# Patient Record
Sex: Female | Born: 1990 | Race: White | Hispanic: No | Marital: Single
Health system: Southern US, Community
[De-identification: ages and names within clinical notes are randomized; demographics above are authoritative.]

---
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
## 2020-04-12 ENCOUNTER — Other Ambulatory Visit: Payer: Self-pay

## 2020-04-12 ENCOUNTER — Ambulatory Visit (HOSPITAL_COMMUNITY)
Admission: EM | Admit: 2020-04-12 | Discharge: 2020-04-12 | Disposition: A | Discharge status: Home / Self Care | Payer: Medicaid Other | Attending: Family | Admitting: Family

## 2020-04-12 DIAGNOSIS — F1510 Other stimulant abuse, uncomplicated: Secondary | ICD-10-CM

## 2020-04-12 DIAGNOSIS — F419 Anxiety disorder, unspecified: Secondary | ICD-10-CM | POA: Insufficient documentation

## 2020-04-12 DIAGNOSIS — F1590 Other stimulant use, unspecified, uncomplicated: Secondary | ICD-10-CM | POA: Diagnosis not present
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
## 2020-04-12 NOTE — Discharge Instructions (Addendum)
Patient is instructed prior to discharge to:  Take all medications as prescribed by his/her mental healthcare provider. Report any adverse effects and or reactions from the medicines to his/her outpatient provider promptly. Keep all scheduled appointments, to ensure that you are getting refills on time and to avoid any interruption in your medication.  If you are unable to keep an appointment call to reschedule.  Be sure to follow-up with resources and follow-up appointments provided.  Patient has been instructed & cautioned: To not engage in alcohol and or illegal drug use while on prescription medicines. In the event of worsening symptoms, patient is instructed to call the crisis hotline, 911 and or go to the nearest ED for appropriate evaluation and treatment of symptoms. To follow-up with his/her primary care provider for your other medical issues, concerns and or health care needs.    Substance abuse resources and Residential Options:  [facility] day residential substance abuse facility (not an option if you have active assault charges). [address], [place], [place] [address] Phone: [phone]: Ask for Shayla in admissions to complete intake if interested in pursuing this option.  [facility]: Can get intake scheduled; (not an option if you have active assault charges). [address] [place], [place] ([phone]) Call Mon-Fri.  [facility]): (offers outpatient therapy and intensive outpatient substance abuse therapy).  [address], [place], [place] [address] Phone: [phone]  [facility] [employer] [facility]: Offers FREE recovery skills classes, support groups, 1:1 Peer Support, and Compeer Classes. [address], [place], [place] [address] Phone: [phone] (Call to complete intake).   [facility] [address] [place], [place] [address] Phone: [phone] ext [phone]  [facility] provides food,  shelter and other programs and services to the homeless men of [place] through our men's program.  By offering safe shelter, three meals a day, clean clothing, Biblical counseling, financial planning, vocational training, GED/education and employment assistance, we've helped mend the shattered lives of many homeless men since opening in 1974.  We have approximately 267 beds available, with a max of 312 beds including mats for emergency situations and currently house an average of 270 men a night.  Prospective Client Check-In Information Photo ID Required (State/ Out of State/ DOC) - if photo ID is not available, clients are required to have a printout of a police/sheriff's criminal history report. Help out with chores around the Mission. No sex offender of any type (pending, charged, registered and/or any other sex related offenses) will be permitted to check in. Must be willing to abide by all rules, regulations, and policies established by the [facility]. The following will be provided - shelter, food, clothing, and biblical counseling. If you or someone you know is in need of assistance at our men's shelter in [place], [place], please call [phone] ext. [phone].    Homeless Shelter List:     [facility] ([facility])  [address] [place], [place]  Phone: [phone]     [facility]  [address], [place], [place] [address]  Phone: [phone]     [facility] (Women only)  [address], [place], [place] [address]  Phone: [phone]     [facility]  [address] [place], [place] [address]  Phone: [phone]     [facility]:  [address]  [place], [place] [address]    Phone: [phone]     [facility]  [address], [place], [place] [address]  (Check in at 6:00PM for placement at  a local shelter)  Phone: [phone]  

## 2020-04-12 NOTE — ED Notes (Signed)
Patient A&O x 4, ambulatory. Patient discharged in no acute distress. Patient denied SI/HI, A/VH upon discharge. Patient verbalized understanding of all discharge instructions explained by staff, to include follow up appointments and safety plan. Patient reported mood 10/10.  Pt belongings returned to patient from locker intact. Patient escorted to lobby via staff for transport to destination. Safety maintained.  

## 2020-04-23 ENCOUNTER — Telehealth (HOSPITAL_COMMUNITY): Payer: Self-pay | Admitting: General Practice

## 2020-04-23 NOTE — Telephone Encounter (Signed)
Care Management - Follow Up [facility] Discharges   Writer made contact with the patient.  Patient reports that she has not made an appointment with a psychiatrist or therapist.   Patient reports that she is waiting for a referral from her PCP.

## 2020-04-28 ENCOUNTER — Ambulatory Visit (HOSPITAL_COMMUNITY)
Admission: EM | Admit: 2020-04-28 | Discharge: 2020-04-28 | Discharge status: Absent without leave | Payer: Medicaid Other

## 2020-04-28 ENCOUNTER — Other Ambulatory Visit: Payer: Self-pay

## 2020-05-17 ENCOUNTER — Other Ambulatory Visit: Payer: Self-pay

## 2020-05-17 ENCOUNTER — Emergency Department (HOSPITAL_COMMUNITY)
Admission: EM | Admit: 2020-05-17 | Discharge: 2020-05-17 | Disposition: A | Discharge status: Home / Self Care | Payer: Medicaid Other | Attending: Emergency Medicine | Admitting: Emergency Medicine

## 2020-05-17 ENCOUNTER — Emergency Department (HOSPITAL_COMMUNITY): Payer: Medicaid Other

## 2020-05-17 ENCOUNTER — Encounter (HOSPITAL_COMMUNITY): Payer: Self-pay

## 2020-05-17 DIAGNOSIS — F4325 Adjustment disorder with mixed disturbance of emotions and conduct: Secondary | ICD-10-CM | POA: Diagnosis not present

## 2020-05-17 DIAGNOSIS — Z046 Encounter for general psychiatric examination, requested by authority: Secondary | ICD-10-CM

## 2020-05-17 DIAGNOSIS — F4323 Adjustment disorder with mixed anxiety and depressed mood: Secondary | ICD-10-CM

## 2020-05-17 DIAGNOSIS — Z20822 Contact with and (suspected) exposure to covid-19: Secondary | ICD-10-CM | POA: Insufficient documentation

## 2020-05-17 LAB — RESP PANEL BY RT-PCR (FLU A&B, COVID) ARPGX2
Influenza A by PCR: NEGATIVE
Influenza B by PCR: NEGATIVE
SARS Coronavirus 2 by RT PCR: NEGATIVE

## 2020-05-17 LAB — COMPREHENSIVE METABOLIC PANEL
ALT: 16 U/L (ref 0–44)
AST: 20 U/L (ref 15–41)
Albumin: 5.2 g/dL — ABNORMAL HIGH (ref 3.5–5.0)
Alkaline Phosphatase: 81 U/L (ref 38–126)
Anion gap: 11 (ref 5–15)
BUN: 8 mg/dL (ref 6–20)
CO2: 25 mmol/L (ref 22–32)
Calcium: 9.6 mg/dL (ref 8.9–10.3)
Chloride: 101 mmol/L (ref 98–111)
Creatinine, Ser: 0.71 mg/dL (ref 0.44–1.00)
GFR, Estimated: >60 mL/min (ref >60)
Glucose, Bld: 107 mg/dL — ABNORMAL HIGH (ref 70–99)
Potassium: 3.6 mmol/L (ref 3.5–5.1)
Sodium: 137 mmol/L (ref 135–145)
Total Bilirubin: 1.1 mg/dL (ref 0.3–1.2)
Total Protein: 8.8 g/dL — ABNORMAL HIGH (ref 6.5–8.1)

## 2020-05-17 LAB — RAPID URINE DRUG SCREEN, HOSP PERFORMED
Amphetamines: NOT DETECTED
Barbiturates: NOT DETECTED
Benzodiazepines: NOT DETECTED
Cocaine: NOT DETECTED
Opiates: NOT DETECTED
Tetrahydrocannabinol: NOT DETECTED

## 2020-05-17 LAB — URINALYSIS, ROUTINE W REFLEX MICROSCOPIC
Bacteria, UA: NONE SEEN
Bilirubin Urine: NEGATIVE
Glucose, UA: NEGATIVE mg/dL
Hgb urine dipstick: NEGATIVE
Ketones, ur: NEGATIVE mg/dL
Leukocytes,Ua: NEGATIVE
Nitrite: NEGATIVE
Protein, ur: NEGATIVE mg/dL
Specific Gravity, Urine: 1.006 (ref 1.005–1.030)
pH: 5 (ref 5.0–8.0)

## 2020-05-17 LAB — CBC
HCT: 44.6 % (ref 36.0–46.0)
Hemoglobin: 14.6 g/dL (ref 12.0–15.0)
MCH: 29 pg (ref 26.0–34.0)
MCHC: 32.7 g/dL (ref 30.0–36.0)
MCV: 88.5 fL (ref 80.0–100.0)
Platelets: 315 10*3/uL (ref 150–400)
RBC: 5.04 MIL/uL (ref 3.87–5.11)
RDW: 14.6 % (ref 11.5–15.5)
WBC: 19.3 10*3/uL — ABNORMAL HIGH (ref 4.0–10.5)
nRBC: 0 % (ref 0.0–0.2)

## 2020-05-17 LAB — ACETAMINOPHEN LEVEL: Acetaminophen (Tylenol), Serum: <10 ug/mL — ABNORMAL LOW (ref 10–30)

## 2020-05-17 LAB — SALICYLATE LEVEL: Salicylate Lvl: <7 mg/dL — ABNORMAL LOW (ref 7.0–30.0)

## 2020-05-17 LAB — ETHANOL: Alcohol, Ethyl (B): <10 mg/dL (ref <10)

## 2020-05-17 LAB — I-STAT BETA HCG BLOOD, ED (MC, WL, AP ONLY): I-stat hCG, quantitative: <5 m[IU]/mL (ref <5)

## 2020-05-17 MED ORDER — QUETIAPINE FUMARATE 50 MG PO TABS: 50.0000 mg | ORAL_TABLET | Freq: Every day | ORAL | Status: DC

## 2020-05-17 MED ORDER — NICOTINE 14 MG/24HR TD PT24
14.0000 mg | MEDICATED_PATCH | Freq: Every day | TRANSDERMAL | Status: DC
  Administered 2020-05-17: 14 mg via TRANSDERMAL
  Filled 2020-05-17: qty 1

## 2020-05-17 NOTE — BHH Counselor (Signed)
TTS assessment complete. Pending provider disposition. 

## 2020-05-17 NOTE — Consult Note (Signed)
Telepsych Consultation   Reason for Consult: Involuntary commitment petition Referring Physician: [facility] physician Location of Patient: [facility] Location of Provider: [facility] TTS Department  Patient Identification: Kristi Caldwell MRN:  [id number] Principal Diagnosis: Adjustment disorder with mixed anxiety and depressed mood Diagnosis:  Principal Problem:   Adjustment disorder with mixed anxiety and depressed mood   Total Time spent with patient: 30 minutes  Subjective:   Kristi Caldwell is a 30 y.o. female patient.  Patient states "I did not threaten to kill my mom or anyone else, I do not intend to harm anyone, ever." Patient reports while she did not threaten to kill her mother or her boyfriend's girlfriend she did argue with her mother on yesterday.  Patient states she feels that her mother "shuts me down every time I try to talk to her."  Patient states she does argue with her mother periodically.  Patient denies any physical aggression during these arguments.  HPI:  Patient reports recent stressors include "being stuck at mom's home."  Patient also reports being frustrated that she is currently not employed, not attending school, and does not have a driver's license or car. Patient feels that she is being judged by her past by her mother.  Patient reports while she has a history of substance use disorder she has been sober for some time and feels that mother continues to judge her based on previous choices. Patient reports she has spoken with mother about leaving her home but currently does not have "anywhere else to go."  Patient reports she is interested in a halfway house if 1 is available in [place] area.  Patient assessed by nurse practitioner.  Patient alert and oriented, answers appropriately.  Patient becomes tearful when discussing strained relationship with her mother. Patient denies suicidal and homicidal  ideations.  Patient denies self-harm behaviors.  Patient denies both auditory and visual hallucinations.  There is no evidence of delusional thought content and no indication that patient is responding to internal stimuli.  Patient denies symptoms of paranoia.  Patient resides in [place] with her mother and stepfather.  Patient denies access to weapons.  Patient is currently unemployed.  Patient denies both alcohol and substance use.  Patient reports she has a history of substance use disorder.  Patient endorses average sleep and appetite.  Patient reports she was diagnosed with bipolar disorder as well as schizophrenia while in crisis during birth of second child.  Patient reports she feels that these diagnoses are not accurate.  Patient reports she is compliant with Seroquel and feels this medication is effective.  Patient gives verbal consent to speak with her mother, Sydell Axon.  TTS counselor spoke with patient's mother who reports she would prefer patient not return to her home.    Past Psychiatric History: Methamphetamine use disorder  Risk to Self:  Denies Risk to Others:  Denies Prior Inpatient Therapy:  Yes, last admission in [place] Prior Outpatient Therapy:  Prior outpatient psychiatric provider in [place]  Past Medical History: History reviewed. No pertinent past medical history. History reviewed. No pertinent surgical history. Family History: No family history on file. Family Psychiatric  History: None reported Social History:  Social History   Substance and Sexual Activity  Alcohol Use None     Social History   Substance and Sexual Activity  Drug Use Not on file    Social History   Socioeconomic History  . Marital status: Single    Spouse name: Not on  file  . Number of children: Not on file  . Years of education: Not on file  . Highest education level: Not on file  Occupational History  . Not on file  Tobacco Use  . Smoking status: Not on file  .  Smokeless tobacco: Not on file  Substance and Sexual Activity  . Alcohol use: Not on file  . Drug use: Not on file  . Sexual activity: Not on file  Other Topics Concern  . Not on file  Social History Narrative  . Not on file   Social Determinants of Health   Financial Resource Strain: Not on file  Food Insecurity: Not on file  Transportation Needs: Not on file  Physical Activity: Not on file  Stress: Not on file  Social Connections: Not on file   Additional Social History:    Allergies:  No Known Allergies  Labs:  Results for orders placed or performed during the hospital encounter of 05/17/20 (from the past 48 hour(s))  Rapid urine drug screen (hospital performed)     Status: None   Collection Time: 05/17/20  3:14 AM  Result Value Ref Range   Opiates NONE DETECTED NONE DETECTED   Cocaine NONE DETECTED NONE DETECTED   Benzodiazepines NONE DETECTED NONE DETECTED   Amphetamines NONE DETECTED NONE DETECTED   Tetrahydrocannabinol NONE DETECTED NONE DETECTED   Barbiturates NONE DETECTED NONE DETECTED    Comment: (NOTE) DRUG SCREEN FOR MEDICAL PURPOSES ONLY.  IF CONFIRMATION IS NEEDED FOR ANY PURPOSE, NOTIFY LAB WITHIN 5 DAYS.  LOWEST DETECTABLE LIMITS FOR URINE DRUG SCREEN Drug Class                     Cutoff (ng/mL) Amphetamine and metabolites    1000 Barbiturate and metabolites    200 Benzodiazepine                 200 Tricyclics and metabolites     300 Opiates and metabolites        300 Cocaine and metabolites        300 THC                            50 Performed at [facility], [address], [place], [place] [address]   Urinalysis, Routine w reflex microscopic     Status: Abnormal   Collection Time: 05/17/20  3:14 AM  Result Value Ref Range   Color, Urine YELLOW YELLOW   APPearance HAZY (A) CLEAR   Specific Gravity, Urine 1.006 1.005 - 1.030   pH 5.0 5.0 - 8.0   Glucose, UA NEGATIVE NEGATIVE mg/dL   Hgb urine dipstick NEGATIVE  NEGATIVE   Bilirubin Urine NEGATIVE NEGATIVE   Ketones, ur NEGATIVE NEGATIVE mg/dL   Protein, ur NEGATIVE NEGATIVE mg/dL   Nitrite NEGATIVE NEGATIVE   Leukocytes,Ua NEGATIVE NEGATIVE   RBC / HPF 0-5 0 - 5 RBC/hpf   Bacteria, UA NONE SEEN NONE SEEN   Squamous Epithelial / LPF 6-10 0 - 5    Comment: Performed at [facility], [address], [place], [place] [address]  Comprehensive metabolic panel     Status: Abnormal   Collection Time: 05/17/20  3:24 AM  Result Value Ref Range   Sodium 137 135 - 145 mmol/L   Potassium 3.6 3.5 - 5.1 mmol/L   Chloride 101 98 - 111 mmol/L   CO2 25 22 - 32 mmol/L  Glucose, Bld 107 (H) 70 - 99 mg/dL    Comment: Glucose reference range applies only to samples taken after fasting for at least 8 hours.   BUN 8 6 - 20 mg/dL   Creatinine, Ser 0.34 0.44 - 1.00 mg/dL   Calcium 9.6 8.9 - 74.2 mg/dL   Total Protein 8.8 (H) 6.5 - 8.1 g/dL   Albumin 5.2 (H) 3.5 - 5.0 g/dL   AST 20 15 - 41 U/L   ALT 16 0 - 44 U/L   Alkaline Phosphatase 81 38 - 126 U/L   Total Bilirubin 1.1 0.3 - 1.2 mg/dL   GFR, Estimated >59 >56 mL/min    Comment: (NOTE) Calculated using the CKD-EPI Creatinine Equation (2021)    Anion gap 11 5 - 15    Comment: Performed at [facility], [address] [place], [place] [address]  Ethanol     Status: None   Collection Time: 05/17/20  3:24 AM  Result Value Ref Range   Alcohol, Ethyl (B) <10 <10 mg/dL    Comment: (NOTE) Lowest detectable limit for serum alcohol is 10 mg/dL.  For medical purposes only. Performed at [facility], [address], [place], [place] [address]   Salicylate level     Status: Abnormal   Collection Time: 05/17/20  3:24 AM  Result Value Ref Range   Salicylate Lvl <7.0 (L) 7.0 - 30.0 mg/dL    Comment: Performed at [facility], [address] [place], [place] [address]  Acetaminophen level     Status: Abnormal   Collection Time:  05/17/20  3:24 AM  Result Value Ref Range   Acetaminophen (Tylenol), Serum <10 (L) 10 - 30 ug/mL    Comment: (NOTE) Therapeutic concentrations vary significantly. A range of 10-30 ug/mL  may be an effective concentration for many patients. However, some  are best treated at concentrations outside of this range. Acetaminophen concentrations >150 ug/mL at 4 hours after ingestion  and >50 ug/mL at 12 hours after ingestion are often associated with  toxic reactions.  Performed at [facility], [address] [place], [place] [address]   cbc     Status: Abnormal   Collection Time: 05/17/20  3:24 AM  Result Value Ref Range   WBC 19.3 (H) 4.0 - 10.5 K/uL   RBC 5.04 3.87 - 5.11 MIL/uL   Hemoglobin 14.6 12.0 - 15.0 g/dL   HCT 30.1 60.1 - 09.3 %   MCV 88.5 80.0 - 100.0 fL   MCH 29.0 26.0 - 34.0 pg   MCHC 32.7 30.0 - 36.0 g/dL   RDW 23.5 57.3 - 22.0 %   Platelets 315 150 - 400 K/uL   nRBC 0.0 0.0 - 0.2 %    Comment: Performed at [facility], [address], [place], [place] [address]  I-Stat beta hCG blood, ED     Status: None   Collection Time: 05/17/20  3:40 AM  Result Value Ref Range   I-stat hCG, quantitative <5.0 <5 mIU/mL   Comment 3            Comment:   GEST. AGE      CONC.  (mIU/mL)   <=1 WEEK        5 - 50     2 WEEKS       50 - 500     3 WEEKS       100 - 10,000     4 WEEKS  1,000 - 30,000        FEMALE AND NON-PREGNANT FEMALE:     LESS THAN 5 mIU/mL   Resp Panel by RT-PCR (Flu A&B, Covid) Nasopharyngeal Swab     Status: None   Collection Time: 05/17/20  4:33 AM   Specimen: Nasopharyngeal Swab; Nasopharyngeal(NP) swabs in vial transport medium  Result Value Ref Range   SARS Coronavirus 2 by RT PCR NEGATIVE NEGATIVE    Comment: (NOTE) SARS-CoV-2 target nucleic acids are NOT DETECTED.  The SARS-CoV-2 RNA is generally detectable in upper respiratory specimens during the acute phase of infection. The lowest concentration of  SARS-CoV-2 viral copies this assay can detect is 138 copies/mL. A negative result does not preclude SARS-Cov-2 infection and should not be used as the sole basis for treatment or other patient management decisions. A negative result may occur with  improper specimen collection/handling, submission of specimen other than nasopharyngeal swab, presence of viral mutation(s) within the areas targeted by this assay, and inadequate number of viral copies(<138 copies/mL). A negative result must be combined with clinical observations, patient history, and epidemiological information. The expected result is Negative.  Fact Sheet for Patients:  EntrepreneurPulse.com.au  Fact Sheet for Healthcare Providers:  IncredibleEmployment.be  This test is no t yet approved or cleared by the [place] FDA and  has been authorized for detection and/or diagnosis of SARS-CoV-2 by FDA under an Emergency Use Authorization (EUA). This EUA will remain  in effect (meaning this test can be used) for the duration of the COVID-19 declaration under Section 564(b)(1) of the Act, 21 U.S.C.section 360bbb-3(b)(1), unless the authorization is terminated  or revoked sooner.       Influenza A by PCR NEGATIVE NEGATIVE   Influenza B by PCR NEGATIVE NEGATIVE    Comment: (NOTE) The Xpert Xpress SARS-CoV-2/FLU/RSV plus assay is intended as an aid in the diagnosis of influenza from Nasopharyngeal swab specimens and should not be used as a sole basis for treatment. Nasal washings and aspirates are unacceptable for Xpert Xpress SARS-CoV-2/FLU/RSV testing.  Fact Sheet for Patients: EntrepreneurPulse.com.au  Fact Sheet for Healthcare Providers: IncredibleEmployment.be  This test is not yet approved or cleared by the [place] FDA and has been authorized for detection and/or diagnosis of SARS-CoV-2 by FDA under an Emergency Use Authorization (EUA).  This EUA will remain in effect (meaning this test can be used) for the duration of the COVID-19 declaration under Section 564(b)(1) of the Act, 21 U.S.C. section 360bbb-3(b)(1), unless the authorization is terminated or revoked.  Performed at [facility], [place] [address] [place], [place] [address]     Medications:  Current Facility-Administered Medications  Medication Dose Route Frequency Provider Last Rate Last Admin  . nicotine (NICODERM CQ - dosed in mg/24 hours) patch 14 mg  14 mg Transdermal Daily Emmaline Kluver, FNP   14 mg at 05/17/20 1037   Current Outpatient Medications  Medication Sig Dispense Refill  . sertraline (ZOLOFT) 25 MG tablet Take 25 mg by mouth daily.      Musculoskeletal: Strength & Muscle Tone: within normal limits Gait & Station: normal Patient leans: N/A  Psychiatric Specialty Exam: Physical Exam Vitals and nursing note reviewed.  Constitutional:      Appearance: She is well-developed.  HENT:     Head: Normocephalic.  Cardiovascular:     Rate and Rhythm: Normal rate.  Pulmonary:     Effort: Pulmonary effort is normal.  Neurological:     Mental Status: She is alert and  oriented to person, place, and time.  Psychiatric:        Attention and Perception: Attention and perception normal.        Mood and Affect: Affect normal. Mood is anxious and depressed.        Speech: Speech normal.        Behavior: Behavior normal. Behavior is cooperative.        Thought Content: Thought content normal.        Cognition and Memory: Cognition and memory normal.        Judgment: Judgment normal.     Review of Systems  Constitutional: Negative.   HENT: Negative.   Eyes: Negative.   Respiratory: Negative.   Cardiovascular: Negative.   Gastrointestinal: Negative.   Genitourinary: Negative.   Musculoskeletal: Negative.   Skin: Negative.   Neurological: Negative.   Psychiatric/Behavioral: The patient is nervous/anxious.     Blood  pressure (!) 100/58, pulse 68, temperature 98.2 F (36.8 C), temperature source Oral, resp. rate 15, SpO2 93 %.There is no height or weight on file to calculate BMI.  General Appearance: Casual and Fairly Groomed  Eye Contact:  Good  Speech:  Clear and Coherent and Normal Rate  Volume:  Normal  Mood:  Anxious and Depressed  Affect:  Congruent  Thought Process:  Coherent, Goal Directed and Descriptions of Associations: Intact  Orientation:  Full (Time, Place, and Person)  Thought Content:  WDL and Logical  Suicidal Thoughts:  No  Homicidal Thoughts:  No  Memory:  Immediate;   Good Recent;   Good Remote;   Good  Judgement:  Good  Insight:  Fair  Psychomotor Activity:  Normal  Concentration:  Concentration: Good and Attention Span: Good  Recall:  Good  Fund of Knowledge:  Good  Language:  Good  Akathisia:  No  Handed:  Right  AIMS (if indicated):     Assets:  Communication Skills Desire for Improvement Financial Resources/Insurance Housing Intimacy Leisure Time Physical Health Resilience Social Support Talents/Skills  ADL's:  Intact  Cognition:  WNL  Sleep:        Treatment Plan Summary: Patient reviewed with Dr. Nelly Rout. Social work consult ordered to offer housing resources. Peer support consult initiated per patient request. Medication management  Restart home medication: -Seroquel 50 mg nightly  Follow-up with outpatient psychiatry, intensive outpatient program at [facility].   Disposition: No evidence of imminent risk to self or others at present.   Patient does not meet criteria for psychiatric inpatient admission. Supportive therapy provided about ongoing stressors. Discussed crisis plan, support from social network, calling 911, coming to the Emergency Department, and calling Suicide Hotline.  This service was provided via telemedicine using a 2-way, interactive audio and video technology.  Names of all persons  participating in this telemedicine service and their role in this encounter. Name: Francee Piccolo Role: Patient  Name: Berneice Heinrich Role: FNP  Name:  Nelly Rout Role: Psychiatrist    Patrcia Dolly, FNP 05/17/2020 10:45 AM
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
## 2020-05-17 NOTE — Patient Outreach (Signed)
CPSS met with Pt an was able to gain information to better assist Pt. CPSS was made aware that Pt has been dealing with Depression, an that it has affected her relationship with her family. CPSS was able to present a few options for services an. CPSS contacted Mrs Phoebe Sharps for setting up services at [facility]. CPSS also issued contact information for ACTT services in [place].

## 2020-05-17 NOTE — Patient Outreach (Incomplete)
ED Peer Support Specialist Patient Intake (Complete at intake & 30-60 Day Follow-up)  Name: Kristi Caldwell  MRN: [id number]  Age: 30 y.o.   Date of Admission: 05/17/2020  Intake: Initial Comments:      Primary Reason Admitted: Depression   Lab values: Alcohol/ETOH: Not completed Positive UDS? 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 No  Number of ED visits:    Reason(s) for visit:    In past year, has patient been hospitalized for any reason? No  Number of hospitalizations:    Reason(s) for hospitalization:    In past year, has patient been arrested? No  Number of arrests:    Reason(s) for arrest:    In past year, has patient been incarcerated? No  Number of incarcerations:    Reason(s) for incarceration:    In past year, has patient received medication-assisted treatment? No  In past year, patient received the following treatments: Other (comment)  In past year, has patient received any harm reduction services? No  Did this include any of the following?    In past year, has patient received care from a mental health provider for diagnosis other than SUD? No  In past year, is this first time patient has overdosed? No  Number of past overdoses:    In past year, is this first time patient has been hospitalized for an overdose? No  Number of hospitalizations for overdose(s):    Is patient currently  receiving treatment for a mental health diagnosis? Yes  Patient reports experiencing difficulty participating in SUD treatment: No    Most important reason(s) for this difficulty?    Has patient received prior services for treatment? No  In past, patient has received services from following agencies:    Plan of Care:  Suggested follow up at these agencies/treatment centers: ACTT [facility] Treatment Team),Individual therapy  Other information: ***   Merlinda Frederick, CPSS  05/17/2020 12:58 PM

## 2020-05-17 NOTE — Progress Notes (Signed)
TOC CM follow up on patient, she has list of local shelters. Her mother is providing transportation. Isidoro Donning RN CCM, [facility] ED TOC CM [phone]

## 2020-05-17 NOTE — ED Notes (Signed)
Pt given d/c instructions and advised to call women's shelters prior to pt mother arrival. Pt is currently making calls at this time

## 2020-05-17 NOTE — BHH Counselor (Addendum)
BHH contacted patient's mother and notified her of disposition. Mother upset and stating she does not feel safe with her returning to her home. This counselor validated her concerns and explained we would be giving her resources for housing. Mother stated well then she would come and pick her up. This counselor informed her that would need to be within 2 hours. She demonstrated understanding.  Mother states her husband will call [facility] to pick patient up when she is ready for d/c. Mother verified she has phone number to [facility] ED to coordinate pick up time.

## 2020-05-17 NOTE — ED Provider Notes (Signed)
[facility]-EMERGENCY DEPT Provider Note   CSN: [id number] Arrival date & time: 05/17/20  4627     History No chief complaint on file.   Kristi Caldwell is a 30 y.o. female.  Patient with history of bipolar and schizophrenia to ED under Involuntary Commitment by mother reporting noncompliance with medications, increasing volatile behavior with physical and verbal aggression toward mom tonight, auditory and visual (bugs crawling on her) hallucinations. Mom reports the patient has made threats to harm her ex-boyfriend and new girlfriend and used social media to post same threats as well as threats against her mother. The patient denies symptoms.   The history is provided by the patient and the police. No language interpreter was used.       History reviewed. No pertinent past medical history.  There are no problems to display for this patient.   History reviewed. No pertinent surgical history.   OB History   No obstetric history on file.     No family history on file.     Home Medications Prior to Admission medications   Medication Sig Start Date End Date Taking? Authorizing Provider  sertraline (ZOLOFT) 25 MG tablet Take 25 mg by mouth daily.    [provider]    Allergies    Patient has no known allergies.  Review of Systems   Review of Systems  Constitutional: Negative for chills and fever.  HENT: Negative.   Respiratory: Negative.   Cardiovascular: Negative.   Gastrointestinal: Negative.   Musculoskeletal: Negative.   Skin: Negative.   Neurological: Negative.   Psychiatric/Behavioral:       See HPI.    Physical Exam Updated Vital Signs BP 107/72 (BP Location: Left Arm)   Pulse 96   Temp 98.1 F (36.7 C) (Oral)   Resp 18   SpO2 97%   Physical Exam Vitals and nursing note reviewed.  Constitutional:      General: She is not in acute distress.    Appearance: She is well-developed and well-nourished.  Cardiovascular:      Rate and Rhythm: Normal rate.  Pulmonary:     Effort: Pulmonary effort is normal.  Abdominal:     Palpations: Abdomen is soft.  Musculoskeletal:        General: Normal range of motion.     Cervical back: Normal range of motion.  Skin:    General: Skin is warm and dry.  Neurological:     Mental Status: She is alert and oriented to person, place, and time.     ED Results / Procedures / Treatments   Labs (all labs ordered are listed, but only abnormal results are displayed) Labs Reviewed  COMPREHENSIVE METABOLIC PANEL - Abnormal; Notable for the following components:      Result Value   Glucose, Bld 107 (*)    Total Protein 8.8 (*)    Albumin 5.2 (*)    All other components within normal limits  SALICYLATE LEVEL - Abnormal; Notable for the following components:   Salicylate Lvl <7.0 (*)    All other components within normal limits  ACETAMINOPHEN LEVEL - Abnormal; Notable for the following components:   Acetaminophen (Tylenol), Serum <10 (*)    All other components within normal limits  CBC - Abnormal; Notable for the following components:   WBC 19.3 (*)    All other components within normal limits  RESP PANEL BY RT-PCR (FLU A&B, COVID) [id number]  ETHANOL  RAPID URINE DRUG SCREEN, HOSP PERFORMED  URINALYSIS, ROUTINE  W REFLEX MICROSCOPIC  I-STAT BETA HCG BLOOD, ED ([facility], [facility], [facility] ONLY)   Results for orders placed or performed during the hospital encounter of 05/17/20  Comprehensive metabolic panel  Result Value Ref Range   Sodium 137 135 - 145 mmol/L   Potassium 3.6 3.5 - 5.1 mmol/L   Chloride 101 98 - 111 mmol/L   CO2 25 22 - 32 mmol/L   Glucose, Bld 107 (H) 70 - 99 mg/dL   BUN 8 6 - 20 mg/dL   Creatinine, Ser 1.24 0.44 - 1.00 mg/dL   Calcium 9.6 8.9 - 58.0 mg/dL   Total Protein 8.8 (H) 6.5 - 8.1 g/dL   Albumin 5.2 (H) 3.5 - 5.0 g/dL   AST 20 15 - 41 U/L   ALT 16 0 - 44 U/L   Alkaline Phosphatase 81 38 - 126 U/L   Total Bilirubin 1.1 0.3 - 1.2 mg/dL   GFR, Estimated  >99 >83 mL/min   Anion gap 11 5 - 15  Ethanol  Result Value Ref Range   Alcohol, Ethyl (B) <10 <10 mg/dL  Salicylate level  Result Value Ref Range   Salicylate Lvl <7.0 (L) 7.0 - 30.0 mg/dL  Acetaminophen level  Result Value Ref Range   Acetaminophen (Tylenol), Serum <10 (L) 10 - 30 ug/mL  cbc  Result Value Ref Range   WBC 19.3 (H) 4.0 - 10.5 K/uL   RBC 5.04 3.87 - 5.11 MIL/uL   Hemoglobin 14.6 12.0 - 15.0 g/dL   HCT 38.2 50.5 - 39.7 %   MCV 88.5 80.0 - 100.0 fL   MCH 29.0 26.0 - 34.0 pg   MCHC 32.7 30.0 - 36.0 g/dL   RDW 67.3 41.9 - 37.9 %   Platelets 315 150 - 400 K/uL   nRBC 0.0 0.0 - 0.2 %  Rapid urine drug screen (hospital performed)  Result Value Ref Range   Opiates NONE DETECTED NONE DETECTED   Cocaine NONE DETECTED NONE DETECTED   Benzodiazepines NONE DETECTED NONE DETECTED   Amphetamines NONE DETECTED NONE DETECTED   Tetrahydrocannabinol NONE DETECTED NONE DETECTED   Barbiturates NONE DETECTED NONE DETECTED  Urinalysis, Routine w reflex microscopic  Result Value Ref Range   Color, Urine YELLOW YELLOW   APPearance HAZY (A) CLEAR   Specific Gravity, Urine 1.006 1.005 - 1.030   pH 5.0 5.0 - 8.0   Glucose, UA NEGATIVE NEGATIVE mg/dL   Hgb urine dipstick NEGATIVE NEGATIVE   Bilirubin Urine NEGATIVE NEGATIVE   Ketones, ur NEGATIVE NEGATIVE mg/dL   Protein, ur NEGATIVE NEGATIVE mg/dL   Nitrite NEGATIVE NEGATIVE   Leukocytes,Ua NEGATIVE NEGATIVE   RBC / HPF 0-5 0 - 5 RBC/hpf   Bacteria, UA NONE SEEN NONE SEEN   Squamous Epithelial / LPF 6-10 0 - 5  I-Stat beta hCG blood, ED  Result Value Ref Range   I-stat hCG, quantitative <5.0 <5 mIU/mL   Comment 3            EKG None  Radiology No results found. No results found.  Procedures Procedures (including critical care time)  Medications Ordered in ED Medications - No data to display  ED Course  I have reviewed the triage vital signs and the nursing notes.  Pertinent labs & imaging results that were  available during my care of the patient were reviewed by me and considered in my medical decision making (see chart for details).    MDM Rules/Calculators/A&P  Patient to ED under IVC for uncontrolled bipolar and schizophrenia with stated AVH, threat to harm mother and others, manic episodes. Patient denies.   She is calm and cooperative here. She has an isolated leukocytosis without fever or sign of illness. CXR and UA performed to insure no occult infection. COVID pending as well for medical clearance.   She is considered medically cleared for purposes of TTS consultation.   Final Clinical Impression(s) / ED Diagnoses Final diagnoses:  None   1. IVC  Rx / DC Orders ED Discharge Orders    None       Charlann Lange, Hershal Coria 05/17/20 [id number]    Maudie Flakes, MD 05/17/20 0700

## 2020-05-17 NOTE — ED Triage Notes (Signed)
Pt arrives [facility] under IVC by mother for not taking medications and for auditory and visual hallucinations.

## 2020-05-17 NOTE — ED Notes (Signed)
Behavior [facility] computer placed at pt bedside at this time.

## 2020-05-17 NOTE — Discharge Instructions (Signed)
°  Homeless Shelter List:     Health and safety inspector [facility])  [address] [place], [place]  Phone: [phone]     [facility]  [address] [place], [place] [address]  Phone: [phone]     [facility] ([facility] only)  [address] [place], [place] [address]  Phone: [phone]     [facility]  [address] [place], [place] [address]  Phone: [phone]     [facility]:  [phone]. [address]  [place], [place] [address]  Phone: [phone]     [facility]  [address] [place], [place] [address]  (Check in at 6:00PM for placement at a local shelter)  Phone: [phone]   Substance abuse resources and Residential Options:  [facility] day residential substance abuse facility (not an option if you have active assault charges). [address] [place], [place] [address] Phone: [phone]: Ask for Drema Pry in admissions to complete intake if interested in pursuing this option.  [facility]: Can get intake scheduled; (not an option if you have active assault charges). [address] [place], [place] [phone]) Call Mon-Fri.  [facility]): (offers outpatient therapy and intensive outpatient substance abuse therapy).  [address] [place], [place] [address] Phone: [phone]  [facility]: Offers FREE recovery skills classes, support groups, 1:1 Peer Support, and Compeer Classes. [address] [place], [place] [address] Phone: [phone] (Call to complete intake).   [facility] [address] [place], [place] [address] Phone: [phone] ext [phone]  [facility] provides food, shelter and other programs and services to the homeless men of [place] through our mens program.  By offering safe  shelter, three meals a day, clean clothing, Biblical counseling, financial planning, vocational training, GED/education and employment assistance, weve helped mend the shattered lives of many homeless men since opening in 1974.  We have approximately 267 beds available, with a max of 312 beds including mats for emergency situations and currently house an average of 270 men a night.  Prospective Client Check-In Information Photo ID Required (State/ Out of State/ [facility]) - if photo ID is not available, clients are required to have a printout of a police/sheriffs criminal history report. Help out with chores around the [facility]. No sex offender of any type (pending, charged, registered and/or any other sex related offenses) will be permitted to check in. Must be willing to abide by all rules, regulations, and policies established by the [employer]. The following will be provided - shelter, food, clothing, and biblical counseling. If you or someone you know is in need of assistance at our mens shelter in [place], [place], please call [phone] ext. [phone].

## 2020-06-25 ENCOUNTER — Emergency Department (HOSPITAL_COMMUNITY)
Admission: EM | Admit: 2020-06-25 | Discharge: 2020-06-25 | Disposition: A | Discharge status: Home / Self Care | Payer: Medicaid Other | Attending: Emergency Medicine | Admitting: Emergency Medicine

## 2020-06-25 DIAGNOSIS — Z23 Encounter for immunization: Secondary | ICD-10-CM | POA: Insufficient documentation

## 2020-06-25 DIAGNOSIS — R000 Tachycardia, unspecified: Secondary | ICD-10-CM | POA: Diagnosis not present

## 2020-06-25 DIAGNOSIS — R4182 Altered mental status, unspecified: Secondary | ICD-10-CM | POA: Diagnosis not present

## 2020-06-25 DIAGNOSIS — S0993XA Unspecified injury of face, initial encounter: Secondary | ICD-10-CM | POA: Diagnosis present

## 2020-06-25 DIAGNOSIS — S0181XA Laceration without foreign body of other part of head, initial encounter: Secondary | ICD-10-CM | POA: Insufficient documentation

## 2020-06-25 DIAGNOSIS — X58XXXA Exposure to other specified factors, initial encounter: Secondary | ICD-10-CM | POA: Diagnosis not present

## 2020-06-25 DIAGNOSIS — Z20822 Contact with and (suspected) exposure to covid-19: Secondary | ICD-10-CM | POA: Insufficient documentation

## 2020-06-25 DIAGNOSIS — Y9259 Other trade areas as the place of occurrence of the external cause: Secondary | ICD-10-CM | POA: Diagnosis not present

## 2020-06-25 DIAGNOSIS — F1510 Other stimulant abuse, uncomplicated: Secondary | ICD-10-CM

## 2020-06-25 LAB — RAPID URINE DRUG SCREEN, HOSP PERFORMED
Amphetamines: POSITIVE — AB
Barbiturates: NOT DETECTED
Benzodiazepines: NOT DETECTED
Cocaine: NOT DETECTED
Opiates: NOT DETECTED
Tetrahydrocannabinol: NOT DETECTED

## 2020-06-25 LAB — CBC WITH DIFFERENTIAL/PLATELET
Abs Immature Granulocytes: 0.2 10*3/uL — ABNORMAL HIGH (ref 0.00–0.07)
Basophils Absolute: 0.1 10*3/uL (ref 0.0–0.1)
Basophils Relative: 0 %
Eosinophils Absolute: 0 10*3/uL (ref 0.0–0.5)
Eosinophils Relative: 0 %
HCT: 40.2 % (ref 36.0–46.0)
Hemoglobin: 13.4 g/dL (ref 12.0–15.0)
Immature Granulocytes: 2 %
Lymphocytes Relative: 15 %
Lymphs Abs: 1.9 10*3/uL (ref 0.7–4.0)
MCH: 29.3 pg (ref 26.0–34.0)
MCHC: 33.3 g/dL (ref 30.0–36.0)
MCV: 88 fL (ref 80.0–100.0)
Monocytes Absolute: 0.8 10*3/uL (ref 0.1–1.0)
Monocytes Relative: 7 %
Neutro Abs: 9.6 10*3/uL — ABNORMAL HIGH (ref 1.7–7.7)
Neutrophils Relative %: 76 %
Platelets: 289 10*3/uL (ref 150–400)
RBC: 4.57 MIL/uL (ref 3.87–5.11)
RDW: 14.4 % (ref 11.5–15.5)
WBC: 12.6 10*3/uL — ABNORMAL HIGH (ref 4.0–10.5)
nRBC: 0 % (ref 0.0–0.2)

## 2020-06-25 LAB — COMPREHENSIVE METABOLIC PANEL
ALT: 34 U/L (ref 0–44)
AST: 42 U/L — ABNORMAL HIGH (ref 15–41)
Albumin: 4.9 g/dL (ref 3.5–5.0)
Alkaline Phosphatase: 69 U/L (ref 38–126)
Anion gap: 14 (ref 5–15)
BUN: 7 mg/dL (ref 6–20)
CO2: 22 mmol/L (ref 22–32)
Calcium: 9.4 mg/dL (ref 8.9–10.3)
Chloride: 101 mmol/L (ref 98–111)
Creatinine, Ser: 0.75 mg/dL (ref 0.44–1.00)
GFR, Estimated: >60 mL/min (ref >60)
Glucose, Bld: 105 mg/dL — ABNORMAL HIGH (ref 70–99)
Potassium: 3.4 mmol/L — ABNORMAL LOW (ref 3.5–5.1)
Sodium: 137 mmol/L (ref 135–145)
Total Bilirubin: 1.2 mg/dL (ref 0.3–1.2)
Total Protein: 8.5 g/dL — ABNORMAL HIGH (ref 6.5–8.1)

## 2020-06-25 LAB — ETHANOL: Alcohol, Ethyl (B): 34 mg/dL — ABNORMAL HIGH (ref <10)

## 2020-06-25 LAB — I-STAT BETA HCG BLOOD, ED (MC, WL, AP ONLY): I-stat hCG, quantitative: <5 m[IU]/mL (ref <5)

## 2020-06-25 LAB — RESP PANEL BY RT-PCR (FLU A&B, COVID) ARPGX2
Influenza A by PCR: NEGATIVE
Influenza B by PCR: NEGATIVE
SARS Coronavirus 2 by RT PCR: NEGATIVE

## 2020-06-25 MED ORDER — TETANUS-DIPHTH-ACELL PERTUSSIS 5-2.5-18.5 LF-MCG/0.5 IM SUSY
0.5000 mL | PREFILLED_SYRINGE | Freq: Once | INTRAMUSCULAR | Status: AC
  Administered 2020-06-25: 0.5 mL via INTRAMUSCULAR
  Filled 2020-06-25: qty 0.5

## 2020-06-25 MED ORDER — SODIUM CHLORIDE 0.9 % IV BOLUS
1000.0000 mL | Freq: Once | INTRAVENOUS | Status: AC
  Administered 2020-06-25: 1000 mL via INTRAVENOUS

## 2020-06-25 NOTE — ED Triage Notes (Signed)
Pt was trespassing at the [facility] 6 and was arrested for trespassing, the jail nurse is concerned about her swollen ankles and states that the patient may have overdosed

## 2020-06-25 NOTE — ED Notes (Signed)
Patient ambulatory to bathroom without assistance Able to provide urine sample upon request Urine sample sent to lab as ordered Patient escorted back to room via [employer]

## 2020-06-25 NOTE — ED Provider Notes (Signed)
[facility]-EMERGENCY DEPT Provider Note  CSN: [id number] Arrival date & time: 06/25/20 0018  Chief Complaint(s) Medical Clearance and Addiction Problem  HPI Kristi Caldwell is a 30 y.o. female brought in by EMS after she was arrested at [facility] for trespassing.  Patient taken to jail but due to her altered mental status, nurse requested medical clearance.  Currently patient is on the influence and has tangential speech. Will not answer questions directly.  Remainder of history, ROS, and physical exam limited due to patient's condition (psych disorder vs drug use). Additional information was obtained from [employer].   Level V Caveat.    HPI  Past Medical History No past medical history on file. Patient Active Problem List   Diagnosis Date Noted  . Adjustment disorder with mixed anxiety and depressed mood 05/17/2020   Home Medication(s) Prior to Admission medications   Medication Sig Start Date End Date Taking? Authorizing Provider  aspirin 325 MG tablet Take 325 mg by mouth See admin instructions. Monday and Friday    [provider]  busPIRone (BUSPAR) 10 MG tablet Take 10 mg by mouth 3 (three) times daily.    [provider]  DULoxetine (CYMBALTA) 30 MG capsule Take 30 mg by mouth daily.    [provider]  QUEtiapine (SEROQUEL) 100 MG tablet Take 100 mg by mouth at bedtime.    [provider]                                                                                                                                    Past Surgical History No past surgical history on file. Family History No family history on file.  Social History   Allergies Patient has no known allergies.  Review of Systems Review of Systems  Unable to perform ROS: Mental status change    Physical Exam Vital Signs  I have reviewed the triage vital signs BP 119/83   Pulse 87   Temp 98.8 F (37.1 C)   Resp 16   Ht 5\' 4"  (1.626 m)   Wt  56.7 kg   SpO2 98%   BMI 21.46 kg/m   Physical Exam Vitals reviewed.  Constitutional:      General: She is not in acute distress.    Appearance: She is well-developed and well-nourished. She is not diaphoretic.  HENT:     Head: Normocephalic. Laceration (1 cm lac on chin) present.     Comments: Dry lips    Nose: Nose normal.  Eyes:     General: No scleral icterus.       Right eye: No discharge.        Left eye: No discharge.     Extraocular Movements: EOM normal.     Conjunctiva/sclera: Conjunctivae normal.     Pupils: Pupils are equal, round, and reactive to light.     Comments: Dilated pupils; equal  Cardiovascular:  Rate and Rhythm: Regular rhythm. Tachycardia present.     Heart sounds: No murmur heard. No friction rub. No gallop.   Pulmonary:     Effort: Pulmonary effort is normal. No respiratory distress.     Breath sounds: Normal breath sounds. No stridor. No rales.  Abdominal:     General: There is no distension.     Palpations: Abdomen is soft.     Tenderness: There is no abdominal tenderness.  Musculoskeletal:        General: No swelling, tenderness, deformity or edema.     Cervical back: Normal range of motion and neck supple.  Skin:    General: Skin is warm and dry.     Findings: No erythema or rash.  Neurological:     Mental Status: She is alert and oriented to person, place, and time.  Psychiatric:        Attention and Perception: She is inattentive.        Mood and Affect: Mood is anxious.        Speech: Speech is tangential.     ED Results and Treatments Labs (all labs ordered are listed, but only abnormal results are displayed) Labs Reviewed  COMPREHENSIVE METABOLIC PANEL - Abnormal; Notable for the following components:      Result Value   Potassium 3.4 (*)    Glucose, Bld 105 (*)    Total Protein 8.5 (*)    AST 42 (*)    All other components within normal limits  ETHANOL - Abnormal; Notable for the following components:   Alcohol, Ethyl  (B) 34 (*)    All other components within normal limits  RAPID URINE DRUG SCREEN, HOSP PERFORMED - Abnormal; Notable for the following components:   Amphetamines POSITIVE (*)    All other components within normal limits  CBC WITH DIFFERENTIAL/PLATELET - Abnormal; Notable for the following components:   WBC 12.6 (*)    Neutro Abs 9.6 (*)    Abs Immature Granulocytes 0.20 (*)    All other components within normal limits  RESP PANEL BY RT-PCR (FLU A&B, COVID) ARPGX2  I-STAT BETA HCG BLOOD, ED ([facility], [facility], [facility] ONLY)                                                                                                                         EKG  EKG Interpretation  Date/Time:  Tuesday June 25 2020 00:27:44 EST Ventricular Rate:  101 PR Interval:    QRS Duration: 86 QT Interval:  343 QTC Calculation: 445 R Axis:   68 Text Interpretation: Sinus tachycardia Right atrial enlargement RSR' in V1 or V2, probably normal variant Nonspecific T abnrm, anterolateral leads No old tracing to compare Confirmed by Drema Pry [phone]) on 06/25/2020 1:52:35 AM      Radiology No results found.  Pertinent labs & imaging results that were available during my care of the patient were reviewed by me and considered in my medical decision making (see chart for  details).  Medications Ordered in ED Medications  sodium chloride 0.9 % bolus 1,000 mL (0 mLs Intravenous Stopped 06/25/20 0240)  Tdap (BOOSTRIX) injection 0.5 mL (0.5 mLs Intramuscular Given 06/25/20 0126)                                                                                                                                    Procedures .1-3 Lead EKG Interpretation Performed by: Nira Connardama, Harleyquinn Gasser Eduardo, MD Authorized by: Nira Connardama, Edrik Rundle Eduardo, MD     Interpretation: abnormal     ECG rate:  135   ECG rate assessment: tachycardic     Rhythm: sinus tachycardia     Ectopy: none     Conduction: normal   ..Laceration Repair  Date/Time:  06/25/2020 6:25 AM Performed by: Nira Connardama, Mylee Falin Eduardo, MD Authorized by: Nira Connardama, Kathaleen Dudziak Eduardo, MD   Consent:    Consent obtained:  Verbal   Consent given by:  Patient   Risks discussed:  Infection, need for additional repair, poor wound healing, poor cosmetic result and vascular damage   Alternatives discussed:  Delayed treatment Universal protocol:    Procedure explained and questions answered to patient or proxy's satisfaction: yes     Patient identity confirmed:  Arm band Anesthesia:    Anesthesia method:  None Laceration details:    Location:  Face   Face location:  Chin   Length (cm):  1   Depth (mm):  3 Exploration:    Hemostasis achieved with:  Direct pressure   Wound extent: no foreign bodies/material noted and no muscle damage noted   Treatment:    Amount of cleaning:  Standard   Debridement:  None Skin repair:    Repair method:  Tissue adhesive Approximation:    Approximation:  Close Repair type:    Repair type:  Simple Post-procedure details:    Procedure completion:  Tolerated well, no immediate complications    (including critical care time)  Medical Decision Making / ED Course I have reviewed the nursing notes for this encounter and the patient's prior records (if available in EHR or on provided paperwork).   Kristi Caldwell was evaluated in Emergency Department on 06/25/2020 for the symptoms described in the history of present illness. She was evaluated in the context of the global COVID-19 pandemic, which necessitated consideration that the patient might be at risk for infection with the SARS-CoV-2 virus that causes COVID-19. Institutional protocols and algorithms that pertain to the evaluation of patients at risk for COVID-19 are in a state of rapid change based on information released by regulatory bodies including the CDC and federal and state organizations. These policies and algorithms were followed during the patient's care in the ED.  Appears to be  under the influence. Likely amphetamine given her exam.  Screening labs reassuring. confirmed amph use. Provided with IVF and allowed to MTF. After several hours, her toxidromal findings improved. She was now more cooperative and  admitted to Meth use.  Does not appear to be in acute psychosis.  Laceration irrigated and closed with dermabond.      Final Clinical Impression(s) / ED Diagnoses Final diagnoses:  Methamphetamine use (HCC)  Chin laceration, initial encounter   The patient appears reasonably screened and/or stabilized for discharge and I doubt any other medical condition or other [facility] requiring further screening, evaluation, or treatment in the ED at this time prior to discharge. Safe for discharge with strict return precautions.  Disposition: Discharge to [employer] custody  Condition: Good  I have discussed the results, Dx and Tx plan with the patient/family who expressed understanding and agree(s) with the plan. Discharge instructions discussed at length. The patient/family was given strict return precautions who verbalized understanding of the instructions. No further questions at time of discharge.    ED Discharge Orders    None        Follow Up: Primary care provider  Call  as needed      This chart was dictated using voice recognition software.  Despite best efforts to proofread,  errors can occur which can change the documentation meaning.   Nira Conn, MD 06/25/20 [phone]

## 2020-08-09 ENCOUNTER — Other Ambulatory Visit: Payer: Self-pay

## 2020-08-09 ENCOUNTER — Encounter (HOSPITAL_COMMUNITY): Payer: Self-pay

## 2020-08-09 ENCOUNTER — Emergency Department (HOSPITAL_COMMUNITY)
Admission: EM | Admit: 2020-08-09 | Discharge: 2020-08-09 | Disposition: A | Discharge status: Home / Self Care | Payer: Medicaid Other | Attending: Emergency Medicine | Admitting: Emergency Medicine

## 2020-08-09 DIAGNOSIS — Z79899 Other long term (current) drug therapy: Secondary | ICD-10-CM | POA: Insufficient documentation

## 2020-08-09 DIAGNOSIS — F1190 Opioid use, unspecified, uncomplicated: Secondary | ICD-10-CM | POA: Insufficient documentation

## 2020-08-09 DIAGNOSIS — F1910 Other psychoactive substance abuse, uncomplicated: Secondary | ICD-10-CM | POA: Clinically undetermined

## 2020-08-09 DIAGNOSIS — F419 Anxiety disorder, unspecified: Secondary | ICD-10-CM | POA: Insufficient documentation

## 2020-08-09 DIAGNOSIS — Z20822 Contact with and (suspected) exposure to covid-19: Secondary | ICD-10-CM | POA: Diagnosis not present

## 2020-08-09 DIAGNOSIS — Z7982 Long term (current) use of aspirin: Secondary | ICD-10-CM | POA: Insufficient documentation

## 2020-08-09 DIAGNOSIS — F1994 Other psychoactive substance use, unspecified with psychoactive substance-induced mood disorder: Secondary | ICD-10-CM | POA: Diagnosis present

## 2020-08-09 LAB — COMPREHENSIVE METABOLIC PANEL
ALT: 15 U/L (ref 0–44)
AST: 22 U/L (ref 15–41)
Albumin: 3.8 g/dL (ref 3.5–5.0)
Alkaline Phosphatase: 65 U/L (ref 38–126)
Anion gap: 8 (ref 5–15)
BUN: 14 mg/dL (ref 6–20)
CO2: 23 mmol/L (ref 22–32)
Calcium: 8.7 mg/dL — ABNORMAL LOW (ref 8.9–10.3)
Chloride: 104 mmol/L (ref 98–111)
Creatinine, Ser: 0.67 mg/dL (ref 0.44–1.00)
GFR, Estimated: >60 mL/min (ref >60)
Glucose, Bld: 115 mg/dL — ABNORMAL HIGH (ref 70–99)
Potassium: 3.1 mmol/L — ABNORMAL LOW (ref 3.5–5.1)
Sodium: 135 mmol/L (ref 135–145)
Total Bilirubin: 0.9 mg/dL (ref 0.3–1.2)
Total Protein: 6.7 g/dL (ref 6.5–8.1)

## 2020-08-09 LAB — CBC
HCT: 40.1 % (ref 36.0–46.0)
Hemoglobin: 12.8 g/dL (ref 12.0–15.0)
MCH: 28.6 pg (ref 26.0–34.0)
MCHC: 31.9 g/dL (ref 30.0–36.0)
MCV: 89.5 fL (ref 80.0–100.0)
Platelets: 253 10*3/uL (ref 150–400)
RBC: 4.48 MIL/uL (ref 3.87–5.11)
RDW: 14.2 % (ref 11.5–15.5)
WBC: 8.7 10*3/uL (ref 4.0–10.5)
nRBC: 0 % (ref 0.0–0.2)

## 2020-08-09 LAB — RESP PANEL BY RT-PCR (FLU A&B, COVID) ARPGX2
Influenza A by PCR: NEGATIVE
Influenza B by PCR: NEGATIVE
SARS Coronavirus 2 by RT PCR: NEGATIVE

## 2020-08-09 LAB — SALICYLATE LEVEL: Salicylate Lvl: <7 mg/dL — ABNORMAL LOW (ref 7.0–30.0)

## 2020-08-09 LAB — I-STAT BETA HCG BLOOD, ED (MC, WL, AP ONLY): I-stat hCG, quantitative: <5 m[IU]/mL (ref <5)

## 2020-08-09 LAB — ETHANOL: Alcohol, Ethyl (B): <10 mg/dL (ref <10)

## 2020-08-09 LAB — ACETAMINOPHEN LEVEL: Acetaminophen (Tylenol), Serum: <10 ug/mL — ABNORMAL LOW (ref 10–30)

## 2020-08-09 MED ORDER — POTASSIUM CHLORIDE CRYS ER 20 MEQ PO TBCR
20.0000 meq | EXTENDED_RELEASE_TABLET | Freq: Once | ORAL | Status: AC
  Administered 2020-08-09: 20 meq via ORAL
  Filled 2020-08-09: qty 1

## 2020-08-09 NOTE — ED Notes (Signed)
Pt DC off unit to home per provider. Pt alert, calm, cooperative, no s/s of distress. DC information given to pt. Belongings given to pt. Pt ambulatory off unit, escorted by RN. Pt transported by family

## 2020-08-09 NOTE — ED Provider Notes (Addendum)
[facility]   CSN: [id number] Arrival date & time: 08/09/20  3790     History Chief Complaint  Patient presents with  . Anxiety    Kristi Caldwell is a 30 y.o. female.  The history is provided by the patient. No language interpreter was used.  Anxiety This is a recurrent problem. The current episode started more than 1 week ago. The problem occurs constantly. The problem has been rapidly worsening. Pertinent negatives include no chest pain, no abdominal pain and no shortness of breath. Nothing aggravates the symptoms. Nothing relieves the symptoms. She has tried nothing for the symptoms.  Pt reports she used heroin yesterday and feels bad today.  Pt report she has a history of schizophrenia.  Pt reports her counselor is in [facility].  Pt not taking her medications because they don't work.  Pt reports she is worried someone is breaking ito her house.      History reviewed. No pertinent past medical history.  Patient Active Problem List   Diagnosis Date Noted  . Adjustment disorder with mixed anxiety and depressed mood 05/17/2020    History reviewed. No pertinent surgical history.   OB History   No obstetric history on file.     History reviewed. No pertinent family history.     Home Medications Prior to Admission medications   Medication Sig Start Date End Date Taking? Authorizing Provider  aspirin 325 MG tablet Take 325 mg by mouth See admin instructions. Monday and Friday    [provider]  busPIRone (BUSPAR) 10 MG tablet Take 10 mg by mouth 3 (three) times daily.    [provider]  DULoxetine (CYMBALTA) 30 MG capsule Take 30 mg by mouth daily.    [provider]  QUEtiapine (SEROQUEL) 100 MG tablet Take 100 mg by mouth at bedtime.    [provider]    Allergies    Patient has no known allergies.  Review of Systems   Review of Systems  Respiratory: Negative for shortness of  breath.   Cardiovascular: Negative for chest pain.  Gastrointestinal: Negative for abdominal pain.  All other systems reviewed and are negative.   Physical Exam Updated Vital Signs BP (!) 90/51   Pulse 72   Temp 97.9 F (36.6 C) (Oral)   Resp 18   SpO2 100%   Physical Exam Vitals and nursing note reviewed.  Constitutional:      Appearance: She is well-developed.  HENT:     Head: Normocephalic.     Nose: Nose normal.     Mouth/Throat:     Mouth: Mucous membranes are moist.  Cardiovascular:     Rate and Rhythm: Normal rate and regular rhythm.  Pulmonary:     Effort: Pulmonary effort is normal.  Abdominal:     General: There is no distension.  Musculoskeletal:        General: Normal range of motion.     Cervical back: Normal range of motion.  Skin:    General: Skin is warm.  Neurological:     General: No focal deficit present.     Mental Status: She is alert and oriented to person, place, and time.  Psychiatric:        Mood and Affect: Mood normal.     ED Results / Procedures / Treatments   Labs (all labs ordered are listed, but only abnormal results are displayed) Labs Reviewed  SALICYLATE LEVEL  ACETAMINOPHEN LEVEL  CBC  RAPID URINE  DRUG SCREEN, HOSP PERFORMED  COMPREHENSIVE METABOLIC PANEL  ETHANOL  PREGNANCY, URINE  I-STAT BETA HCG BLOOD, ED ([facility], [facility], [facility] ONLY)    EKG None  Radiology No results found.  Procedures Procedures   Medications Ordered in ED Medications - No data to display  ED Course  I have reviewed the triage vital signs and the nursing notes.  Pertinent labs & imaging results that were available during my care of the patient were reviewed by me and considered in my medical decision making (see chart for details).    MDM Rules/Calculators/A&P                          MDM: labs reviewed.  TTS consulted for evaluation for evaluation of anxiety/paranoia/unmanged schizophrenia.  Pt awaiting assessment   Final Clinical  Impression(s) / ED Diagnoses Final diagnoses:  Anxiety    Rx / DC Orders ED Discharge Orders    None       Osie Cheeks 08/09/20 [address] 08/09/20 1217    Benjiman Core, MD 08/09/20 1429    Elson Areas, PA-C 08/09/20 1456    Benjiman Core, MD 08/12/20 [phone]

## 2020-08-09 NOTE — ED Triage Notes (Signed)
Patient BIB by [facility] for increased anxiety.  Patient reports not taking her Seroquel because it stopped working.

## 2020-08-09 NOTE — ED Provider Notes (Signed)
The patient has been seen and cleared by TTS.  I have reviewed the note.  Patient's mother is coming to pick her up.   Mancel Bale, MD 08/09/20 [phone]

## 2020-08-09 NOTE — Discharge Instructions (Addendum)
Try to avoid using illegal drugs.  Follow-up for counseling at one of the facilities listed on the attached paperwork.

## 2020-08-09 NOTE — Consult Note (Signed)
Telepsych Consultation   Reason for Consult:  schizophrenic not taking medications. paranoia Referring Physician:  Cheron Schaumann PA-C Location of Patient: [place] [id number] Location of Provider: Behavioral Health TTS Department  Patient Identification: Kristi Caldwell MRN:  [id number] Principal Diagnosis: Substance induced mood disorder (HCC) Diagnosis:  Principal Problem:   Substance induced mood disorder (HCC) Active Problems:   Polysubstance abuse (HCC)  Total Time spent with patient: 30 minutes  Subjective:   Kristi Caldwell is a 30 y.o. female patient admitted with increased anxiety related to substance use.  "My ankles swollen, my hands swollen. I just wasn't feeling good last night. Last used IV heroin around Wednesday; states she typically "snorts". Patient states she called EMS, "I was at the house. I was feeling nauseous and my anxiety was going through the roof. I feel fine now. I'm a little groggy, but I'm okay".  Patient denies any current suicidal or homicidal ideations. States presenting symptoms were due to "using" (IVDU). Patient denies any homicidal ideations, auditory or visual hallucinations, and does not appear to be responding to any external/internal stimuli at this time. Provider discussed current substance use with patient and outpatient resources for recovery and treatment. Patient states she does not feel current use is a problem and declined treatment at this time stating, "I already have a bunch of those papers from the last time I was here. I'll go when I'm ready". Patient contracts for safety and states she will be transported home by her mother; provided verbal consent to obtain collateral and safety plan with mother.   Collateral:  Sydell Axon (mom) [phone] "Her substance abuse is terrible. The police were just with her last night, she used last night. She's lost so much weight. She's OD'd one time 2019 on heroine laced with Fentanyl and it took 3 rounds of  Narcan to bring her back. She's been living in the home with her stepfather and she's bringing in these street people. She's been in [place] since 02/04/20, we kept her pretty clean until Christmas. Ex-husband plans to take patient back to [place] over the next two weeks. She won't do anything to help herself and I can't do it anymore. She's never tried to harm herself, I don't think she will try to kill herself or anyone but the drugs are going to do it. Her doctor is in [place] (OB/GYN) used to manage her medications. She has two children (boys); 5, 2. The 66 year old was taken because he had 7 drugs in his system; he is currently in the [facility] system, he has cognitive issues, sight issues, and we're unsure about his hearing".  39- Mom called back asking provider if she is making the right decision coming to pick her daughter up. Provider provided empathetic presence and listened to mother express frustrations. Mother states she will continue with plan to help patient relocate back to [place] in the next 2 weeks. Provider discussed local treatment facilities and options should patient change her mind regarding treatment or any changes in presentation; mom verbalized an understanding and stated she was outside of the [facility] awaiting patient dismissal.   HPI:   Kristi Caldwell is a 30 year old female who presented to [facility] via EMS for increased anxiety; patient reported not taking her prescribed Seroquel "because it stopped working". Patient endorses recent IVDU that resulted in her "not feeling good". 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   Past  Psychiatric History:   -polysubstance use  -substance induced mood disorder  -adjustment disorder w/ mixed anxiety and depressed mood  Risk to Self:  pt denies Risk to Others:  pt denies Prior Inpatient Therapy:  pt denies Prior Outpatient Therapy:  yes  Past  Medical History: History reviewed. No pertinent past medical history. History reviewed. No pertinent surgical history. Family History: History reviewed. No pertinent family history. Family Psychiatric  History: not noted  Social History:  Social History   Substance and Sexual Activity  Alcohol Use None     Social History   Substance and Sexual Activity  Drug Use Not on file    Social History   Socioeconomic History  . Marital status: Single    Spouse name: Not on file  . Number of children: Not on file  . Years of education: Not on file  . Highest education level: Not on file  Occupational History  . Not on file  Tobacco Use  . Smoking status: Not on file  . Smokeless tobacco: Not on file  Substance and Sexual Activity  . Alcohol use: Not on file  . Drug use: Not on file  . Sexual activity: Not on file  Other Topics Concern  . Not on file  Social History Narrative  . Not on file   Social Determinants of Health   Financial Resource Strain: Not on file  Food Insecurity: Not on file  Transportation Needs: Not on file  Physical Activity: Not on file  Stress: Not on file  Social Connections: Not on file   Additional Social History:   -polysubstance abuse   -current IVDU  Allergies:  No Known Allergies  Labs:  Results for orders placed or performed during the hospital encounter of 08/09/20 (from the past 48 hour(s))  Salicylate level     Status: Abnormal   Collection Time: 08/09/20  6:58 AM  Result Value Ref Range   Salicylate Lvl <7.0 (L) 7.0 - 30.0 mg/dL    Comment: Performed at [facility], [address], [place], [place] [address]  Acetaminophen level     Status: Abnormal   Collection Time: 08/09/20  6:58 AM  Result Value Ref Range   Acetaminophen (Tylenol), Serum <10 (L) 10 - 30 ug/mL    Comment: (NOTE) Therapeutic concentrations vary significantly. A range of 10-30 ug/mL  may be an effective concentration for many patients.  However, some  are best treated at concentrations outside of this range. Acetaminophen concentrations >150 ug/mL at 4 hours after ingestion  and >50 ug/mL at 12 hours after ingestion are often associated with  toxic reactions.  Performed at [facility], [address], [place], [place] [address]   cbc     Status: None   Collection Time: 08/09/20  6:58 AM  Result Value Ref Range   WBC 8.7 4.0 - 10.5 K/uL   RBC 4.48 3.87 - 5.11 MIL/uL   Hemoglobin 12.8 12.0 - 15.0 g/dL   HCT 41.6 60.6 - 30.1 %   MCV 89.5 80.0 - 100.0 fL   MCH 28.6 26.0 - 34.0 pg   MCHC 31.9 30.0 - 36.0 g/dL   RDW 60.1 09.3 - 23.5 %   Platelets 253 150 - 400 K/uL   nRBC 0.0 0.0 - 0.2 %    Comment: Performed at [facility], [address] [place], [place] [address]  Comprehensive metabolic panel     Status: Abnormal   Collection Time: 08/09/20  7:01  AM  Result Value Ref Range   Sodium 135 135 - 145 mmol/L   Potassium 3.1 (L) 3.5 - 5.1 mmol/L   Chloride 104 98 - 111 mmol/L   CO2 23 22 - 32 mmol/L   Glucose, Bld 115 (H) 70 - 99 mg/dL    Comment: Glucose reference range applies only to samples taken after fasting for at least 8 hours.   BUN 14 6 - 20 mg/dL   Creatinine, Ser 2.54 0.44 - 1.00 mg/dL   Calcium 8.7 (L) 8.9 - 10.3 mg/dL   Total Protein 6.7 6.5 - 8.1 g/dL   Albumin 3.8 3.5 - 5.0 g/dL   AST 22 15 - 41 U/L   ALT 15 0 - 44 U/L   Alkaline Phosphatase 65 38 - 126 U/L   Total Bilirubin 0.9 0.3 - 1.2 mg/dL   GFR, Estimated >27 >06 mL/min    Comment: (NOTE) Calculated using the CKD-EPI Creatinine Equation (2021)    Anion gap 8 5 - 15    Comment: Performed at [facility], [address], [place], [place] [address]  Ethanol     Status: None   Collection Time: 08/09/20  7:01 AM  Result Value Ref Range   Alcohol, Ethyl (B) <10 <10 mg/dL    Comment: (NOTE) Lowest detectable limit for serum alcohol is 10 mg/dL.  For medical purposes  only. Performed at [facility], [address] [place], [place] [address]   I-Stat beta hCG blood, ED     Status: None   Collection Time: 08/09/20  8:03 AM  Result Value Ref Range   I-stat hCG, quantitative <5.0 <5 mIU/mL   Comment 3            Comment:   GEST. AGE      CONC.  (mIU/mL)   <=1 WEEK        5 - 50     2 WEEKS       50 - 500     3 WEEKS       100 - 10,000     4 WEEKS     1,000 - 30,000        FEMALE AND NON-PREGNANT FEMALE:     LESS THAN 5 mIU/mL   Resp Panel by RT-PCR (Flu A&B, Covid) Nasopharyngeal Swab     Status: None   Collection Time: 08/09/20  5:11 PM   Specimen: Nasopharyngeal Swab; Nasopharyngeal(NP) swabs in vial transport medium  Result Value Ref Range   SARS Coronavirus 2 by RT PCR NEGATIVE NEGATIVE    Comment: (NOTE) SARS-CoV-2 target nucleic acids are NOT DETECTED.  The SARS-CoV-2 RNA is generally detectable in upper respiratory specimens during the acute phase of infection. The lowest concentration of SARS-CoV-2 viral copies this assay can detect is 138 copies/mL. A negative result does not preclude SARS-Cov-2 infection and should not be used as the sole basis for treatment or other patient management decisions. A negative result may occur with  improper specimen collection/handling, submission of specimen other than nasopharyngeal swab, presence of viral mutation(s) within the areas targeted by this assay, and inadequate number of viral copies(<138 copies/mL). A negative result must be combined with clinical observations, patient history, and epidemiological information. The expected result is Negative.  Fact Sheet for Patients:  BloggerCourse.com  Fact Sheet for Healthcare Providers:  SeriousBroker.it  This test is no t yet approved or cleared by the [place] FDA and  has been authorized for detection and/or  diagnosis of SARS-CoV-2 by FDA under an Emergency Use  Authorization (EUA). This EUA will remain  in effect (meaning this test can be used) for the duration of the COVID-19 declaration under Section 564(b)(1) of the Act, 21 U.S.C.section 360bbb-3(b)(1), unless the authorization is terminated  or revoked sooner.       Influenza A by PCR NEGATIVE NEGATIVE   Influenza B by PCR NEGATIVE NEGATIVE    Comment: (NOTE) The Xpert Xpress SARS-CoV-2/FLU/RSV plus assay is intended as an aid in the diagnosis of influenza from Nasopharyngeal swab specimens and should not be used as a sole basis for treatment. Nasal washings and aspirates are unacceptable for Xpert Xpress SARS-CoV-2/FLU/RSV testing.  Fact Sheet for Patients: BloggerCourse.com  Fact Sheet for Healthcare Providers: SeriousBroker.it  This test is not yet approved or cleared by the [place] FDA and has been authorized for detection and/or diagnosis of SARS-CoV-2 by FDA under an Emergency Use Authorization (EUA). This EUA will remain in effect (meaning this test can be used) for the duration of the COVID-19 declaration under Section 564(b)(1) of the Act, 21 U.S.C. section 360bbb-3(b)(1), unless the authorization is terminated or revoked.  Performed at [facility], [address] [place], [place] [address]    Medications:  No current facility-administered medications for this encounter.   Current Outpatient Medications  Medication Sig Dispense Refill  . aspirin 325 MG tablet Take 325 mg by mouth See admin instructions. Monday and Friday    . busPIRone (BUSPAR) 10 MG tablet Take 10 mg by mouth 3 (three) times daily.    . DULoxetine (CYMBALTA) 30 MG capsule Take 30 mg by mouth daily.    . QUEtiapine (SEROQUEL) 100 MG tablet Take 100 mg by mouth at bedtime.     Musculoskeletal: Strength & Muscle Tone: within normal limits Gait & Station: normal Patient leans: N/A  Psychiatric Specialty Exam: Physical  Exam Psychiatric:        Attention and Perception: Attention and perception normal.        Mood and Affect: Affect is blunt.        Speech: Speech normal.        Behavior: Behavior is withdrawn. Behavior is cooperative.        Thought Content: Thought content normal.        Cognition and Memory: Cognition and memory normal.        Judgment: Judgment is impulsive.     Review of Systems  Psychiatric/Behavioral: Positive for dysphoric mood.  All other systems reviewed and are negative.   Blood pressure 106/60, pulse 61, temperature 98.7 F (37.1 C), temperature source Oral, resp. rate 17, SpO2 98 %.There is no height or weight on file to calculate BMI.  General Appearance: Casual  Eye Contact:  Fair  Speech:  Clear and Coherent  Volume:  Normal  Mood:  Dysphoric  Affect:  Blunt  Thought Process:  Goal Directed  Orientation:  Full (Time, Place, and Person)  Thought Content:  Logical  Suicidal Thoughts:  No  Homicidal Thoughts:  No  Memory:  Immediate;   Fair Recent;   Fair Remote;   Fair  Judgement:  Poor  Insight:  Shallow  Psychomotor Activity:  Normal  Concentration:  Concentration: Fair and Attention Span: Fair  Recall:  [employer] of Knowledge:  Fair  Language:  Fair  Akathisia:  NA  Handed:    AIMS (if indicated):     Assets:  Housing Physical Health Resilience Social Support  ADL's:  Intact  Cognition:  WNL  Sleep:      Treatment Plan Summary: Plan discharge patient with outpatient resources for substance abuse treatment and therapy.   Disposition: No evidence of imminent risk to self or others at present.   Patient does not meet criteria for psychiatric inpatient admission. Supportive therapy provided about ongoing stressors. Discussed crisis plan, support from social network, calling 911, coming to the Emergency Department, and calling Suicide Hotline.  This service was provided via telemedicine using a 2-way, interactive audio and video  technology.  Names of all persons participating in this telemedicine service and their role in this encounter. Name: Maxie BarbBrooke Leevy-Johnson Role: PMHNP  Name: Nelly Routrchana Kumar Role: Attending MD  Name: Francee PiccoloKatie Trompeter Role: patient  Name: Sydell Axonarolyn Murphy Role: Mother    Loletta ParishBrooke A Leevy-Johnson, NP 08/09/2020 7:29 PM

## 2022-01-18 IMAGING — CR DG CHEST 2V
2 series · 2 of 2 positions shown · non-contrast
Comparison: None.

CLINICAL DATA: Medical clearance

EXAM:
CHEST - 2 VIEW

[w chest pa]
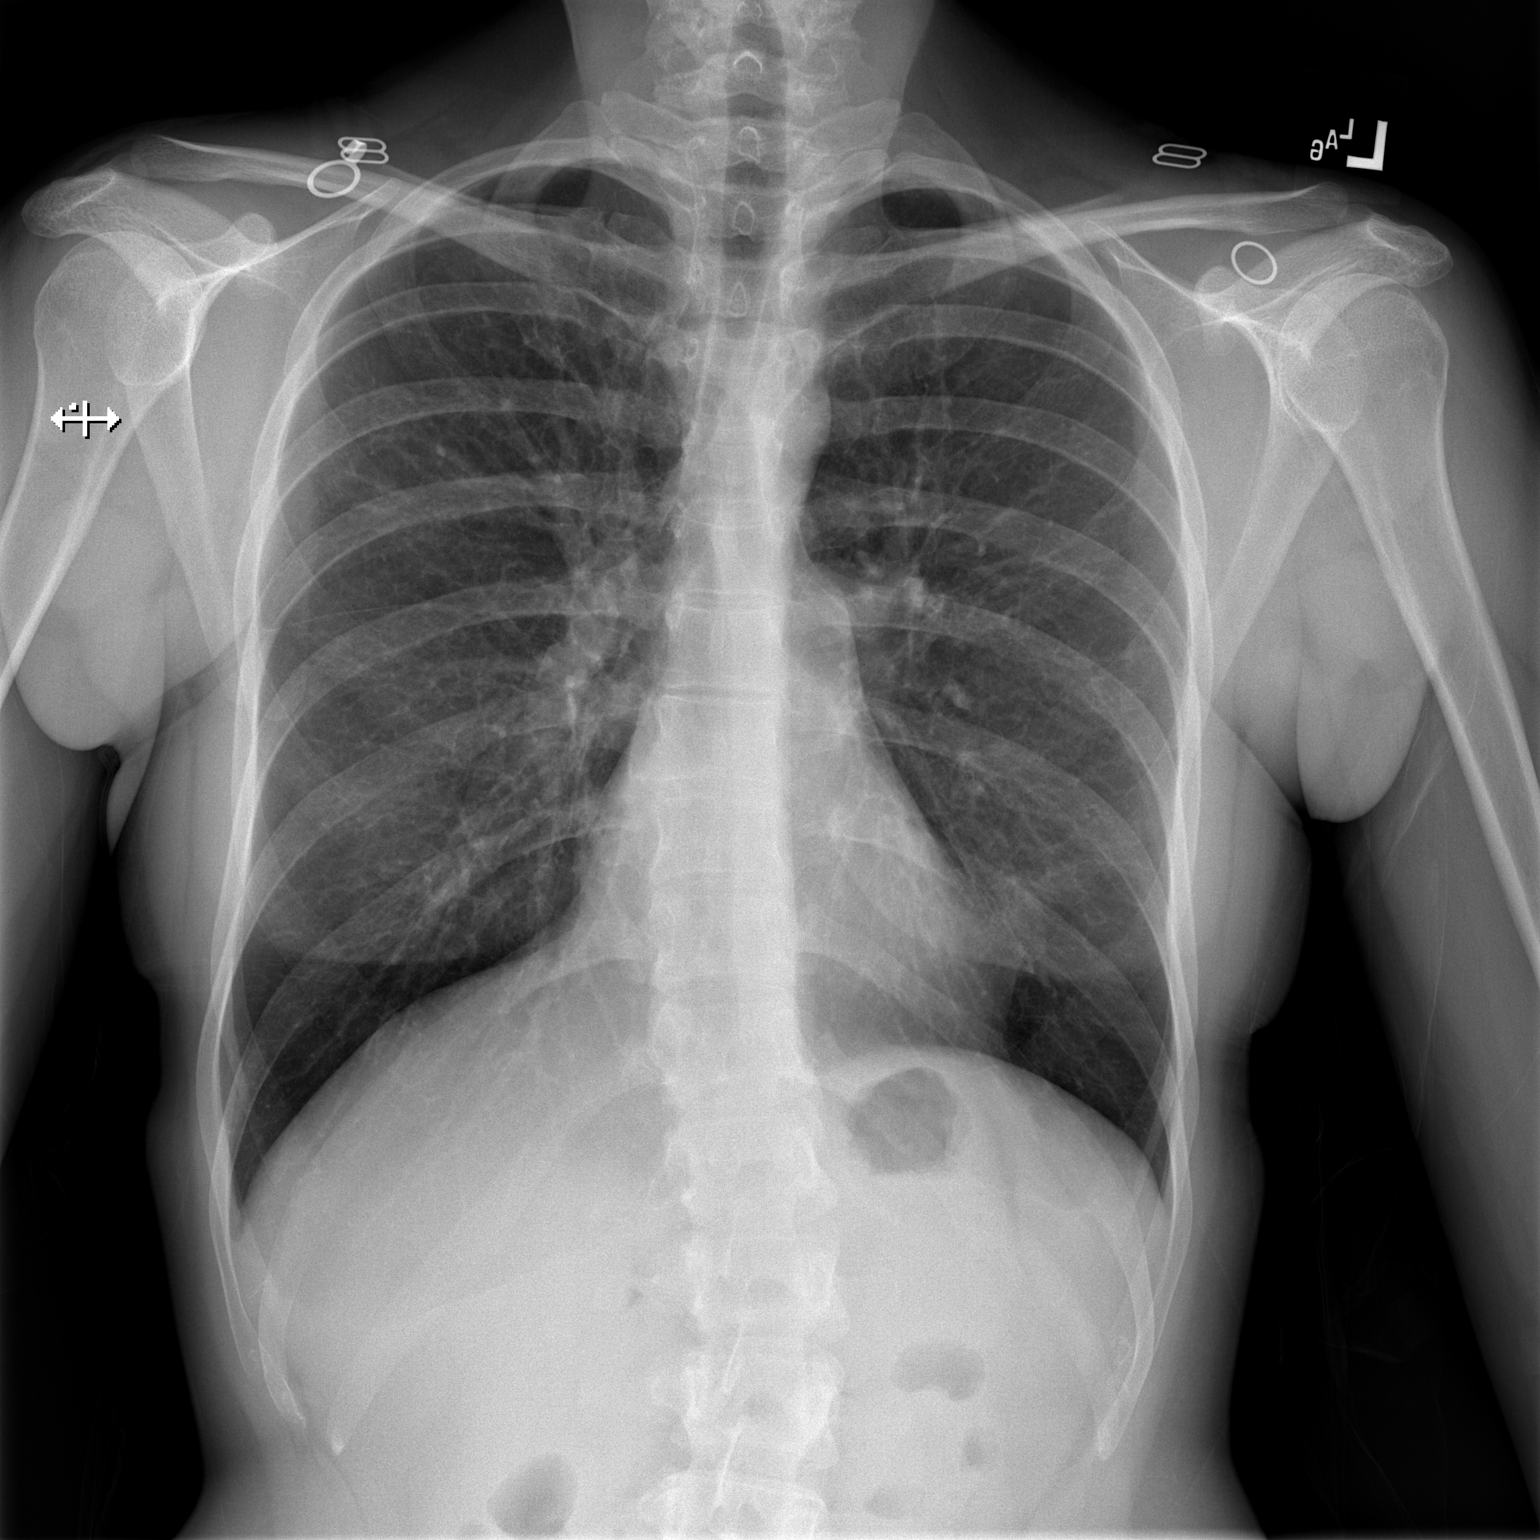

[w chest lat]
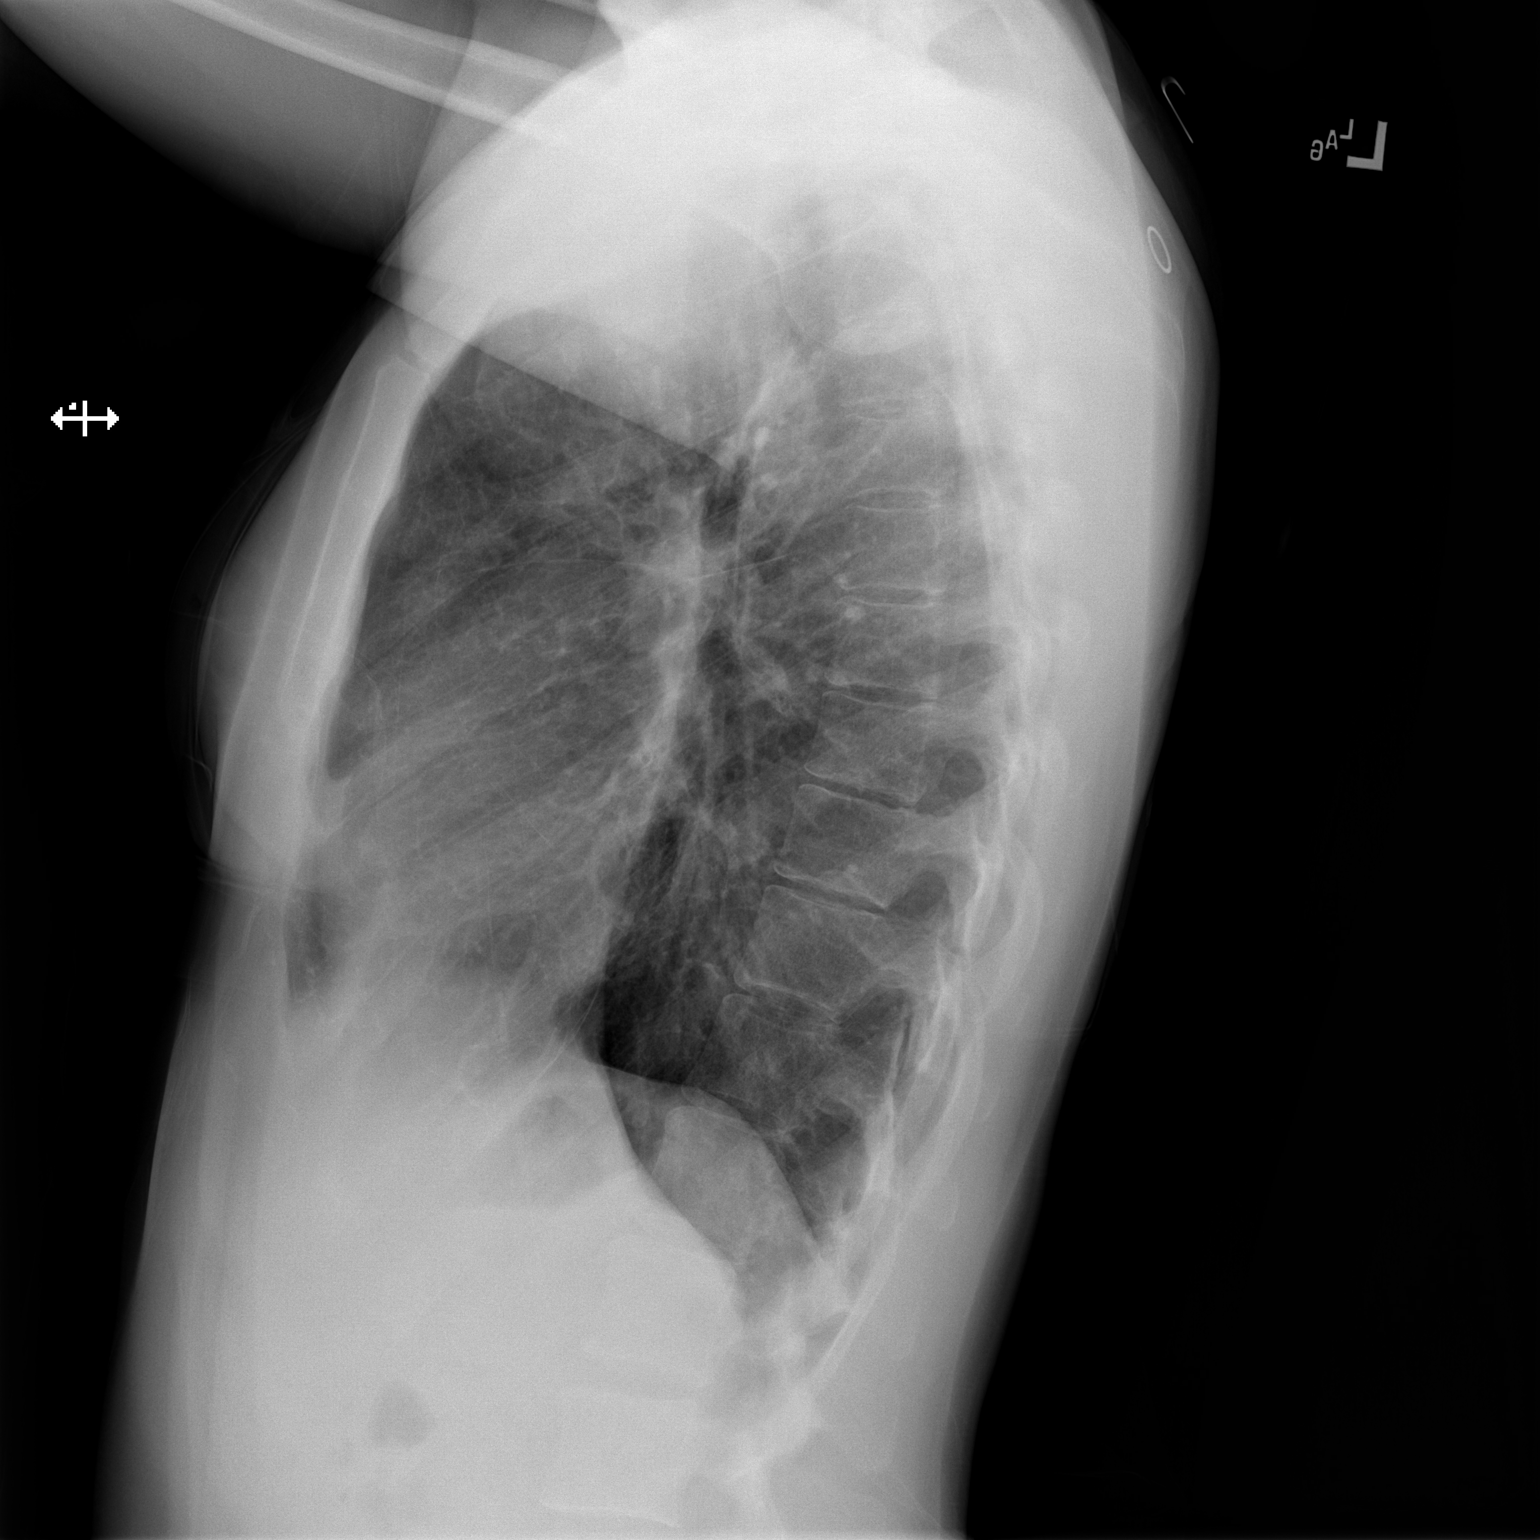

[2 of 2 positions shown; findings below may reference images not displayed]

FINDINGS: Normal heart size and mediastinal contours. No acute infiltrate or
edema. No effusion or pneumothorax. No acute osseous findings. Mild
thoracolumbar scoliosis.
IMPRESSION: No active cardiopulmonary disease.
